# Patient Record
Sex: Female | Born: 2000 | Race: Black or African American | Hispanic: No | Marital: Single | Smoking: Never smoker
Health system: Southern US, Community
[De-identification: ages and names within clinical notes are randomized; demographics above are authoritative.]

## PROBLEM LIST (undated history)

## (undated) HISTORY — PX: ABDOMINAL SURGERY: SHX537

## (undated) HISTORY — PX: ADENOIDECTOMY: SUR15

## (undated) HISTORY — PX: WISDOM TOOTH EXTRACTION: SHX21

## (undated) HISTORY — PX: TONSILLECTOMY: SUR1361

---
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
## 2022-05-25 IMAGING — US US OB < 14 WEEKS - US OB TV
1 series · 14 of 28 positions shown · non-contrast
Comparison: Prior sonogram dated January 05, 2021

CLINICAL DATA: Vaginal bleeding., LMP 04/15/2021. Miscarriage in
late February 2021.

EXAM:
OBSTETRIC <14 WK US AND TRANSVAGINAL OB US
TECHNIQUE: Both transabdominal and transvaginal ultrasound examinations were
performed for complete evaluation of the gestation as well as the
maternal uterus, adnexal regions, and pelvic cul-de-sac.
Transvaginal technique was performed to assess early pregnancy.

[Series 1: us ob comp less 14 wks · 55 acquisitions, 14 frames shown]
[im 3/55]
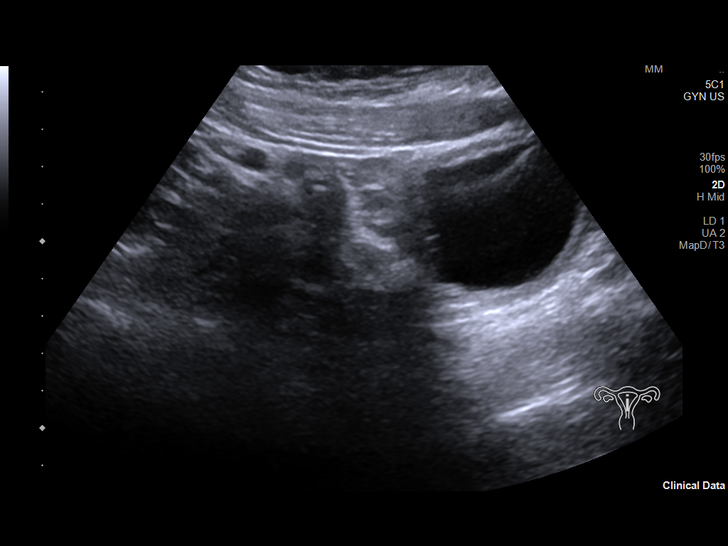
[im 7/55]
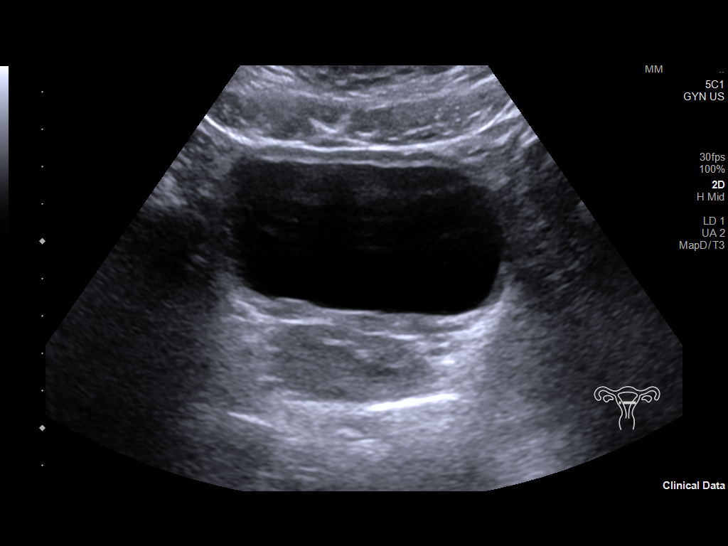
[im 11/55]
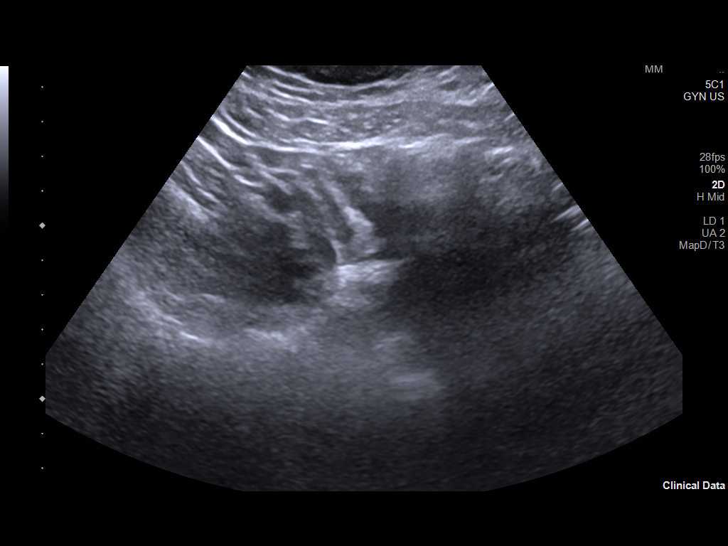
[im 15/55]
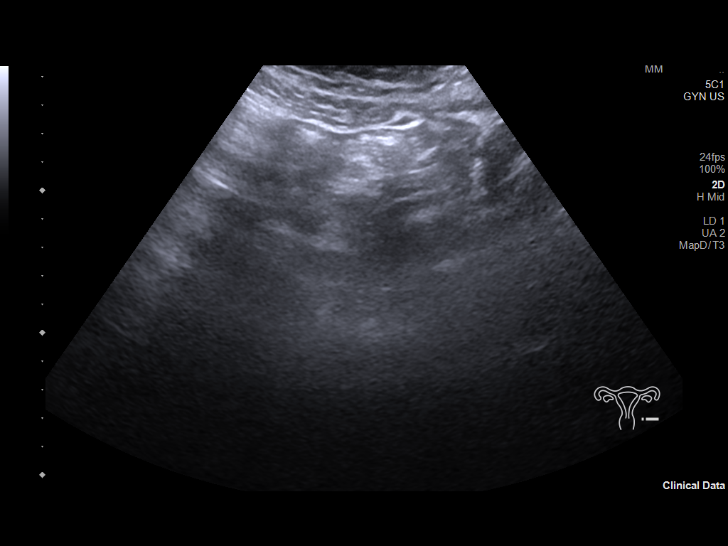
[im 19/55]
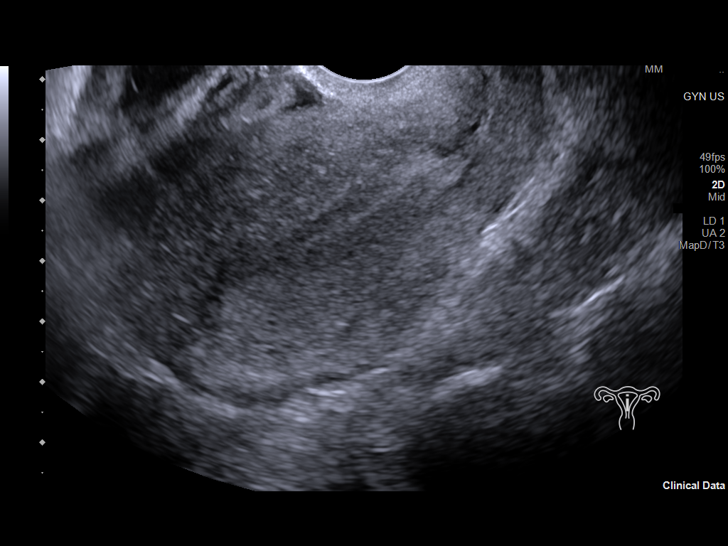
[im 23/55]
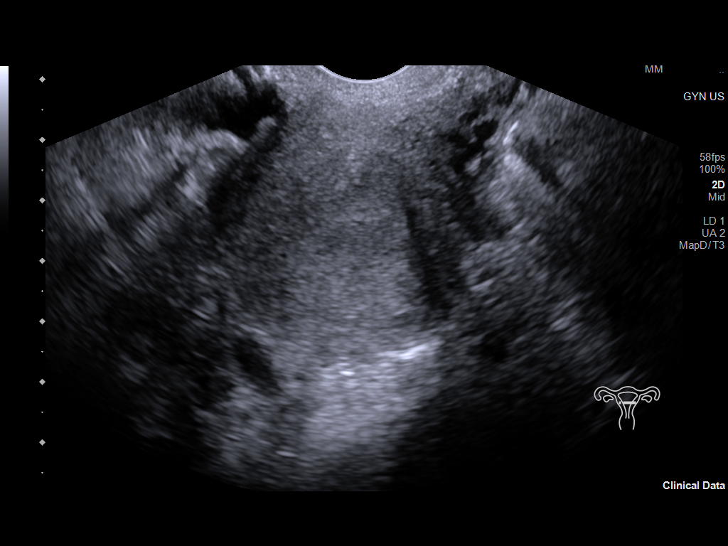
[im 27/55]
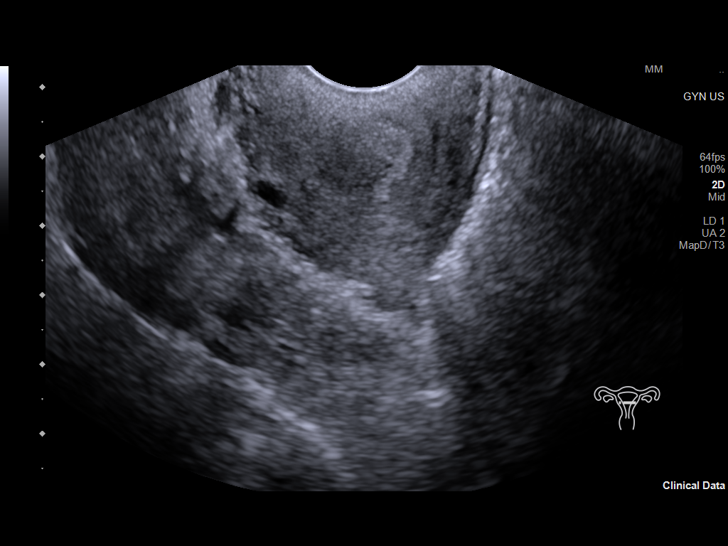
[im 31/55]
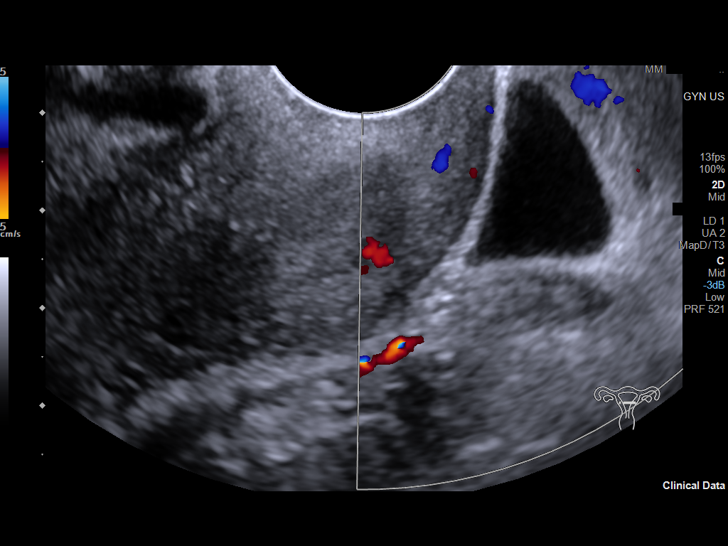
[im 35/55]
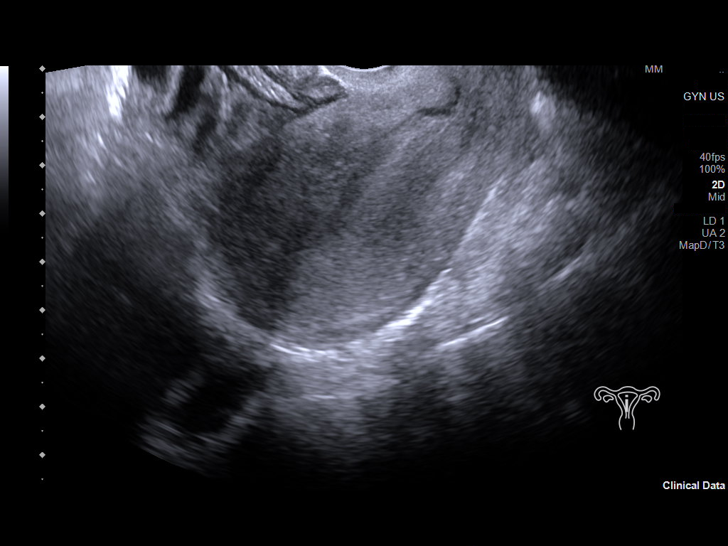
[im 39/55]
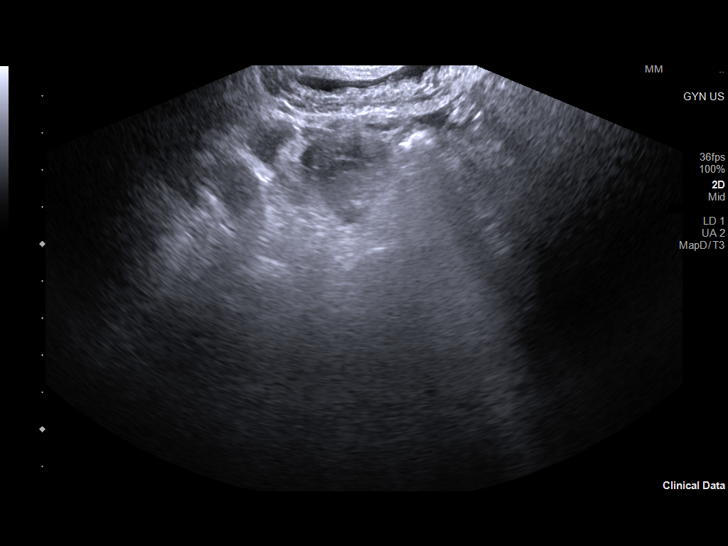
[im 43/55]
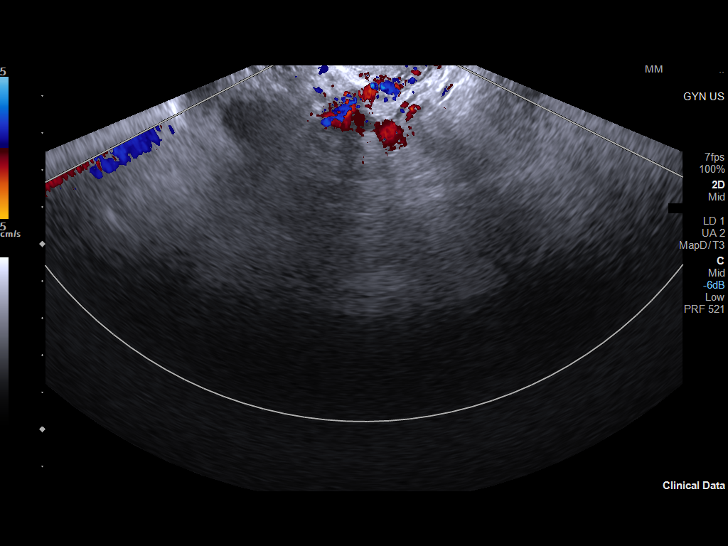
[im 47/55]
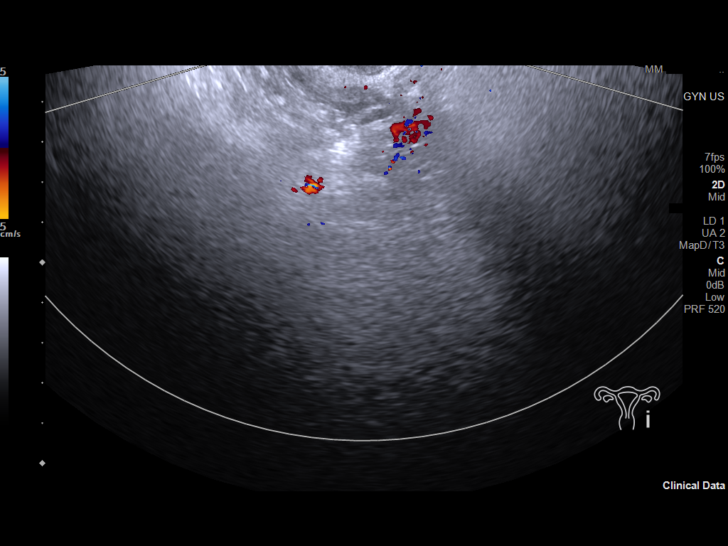
[im 51/55]
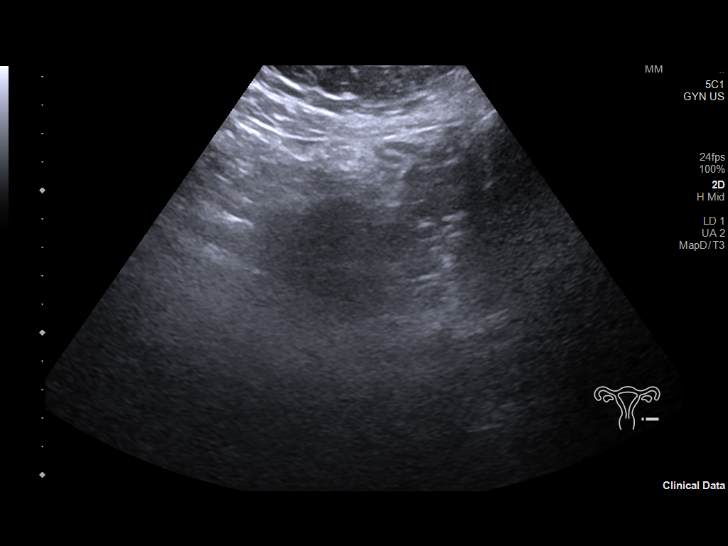
[im 55/55]
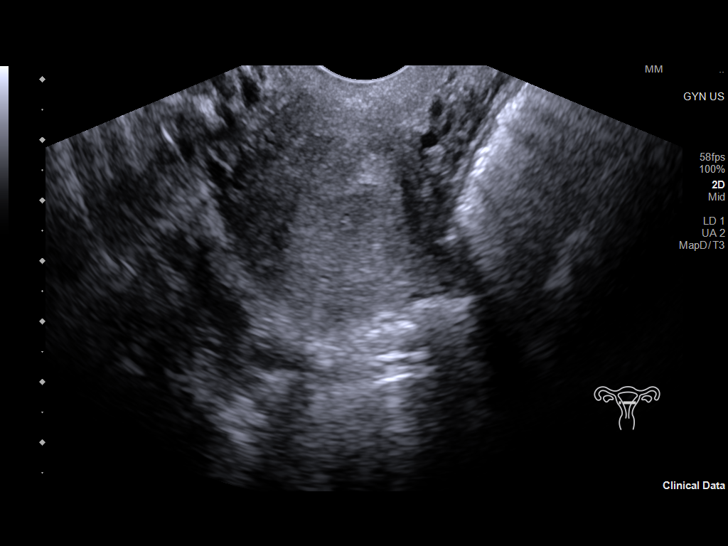

[14 of 28 positions shown; findings below may reference images not displayed]

FINDINGS: Nonvisualization of the gestational sac in the endometrial canal.
The endometrium is heterogeneous measuring up to 9 mm.

Maternal uterus/adnexae: The uterus measures 8.1 x 4.5 x 4.4 cm. The
endometrium measures approximately 9 mm in thickness. Bilateral
ovaries were not visualized.
IMPRESSION: Nonvisualization of the gestational sac. Findings concerning for
pregnancy of unknown location, which may represent early normal or
abnormal gestation or ectopic gestation. Correlate with serial beta
HCG and sonogram as clinically warranted.

## 2023-03-19 ENCOUNTER — Encounter (HOSPITAL_COMMUNITY): Payer: Self-pay | Admitting: Obstetrics and Gynecology

## 2023-03-19 ENCOUNTER — Inpatient Hospital Stay (HOSPITAL_COMMUNITY)
Admission: AD | Admit: 2023-03-19 | Discharge: 2023-03-19 | Disposition: A | Discharge status: Home / Self Care | Payer: Medicaid Other | Attending: Obstetrics and Gynecology | Admitting: Obstetrics and Gynecology

## 2023-03-19 ENCOUNTER — Inpatient Hospital Stay (HOSPITAL_COMMUNITY): Payer: Medicaid Other

## 2023-03-19 DIAGNOSIS — R109 Unspecified abdominal pain: Secondary | ICD-10-CM | POA: Diagnosis present

## 2023-03-19 DIAGNOSIS — O3680X0 Pregnancy with inconclusive fetal viability, not applicable or unspecified: Secondary | ICD-10-CM | POA: Diagnosis not present

## 2023-03-19 DIAGNOSIS — Z3A01 Less than 8 weeks gestation of pregnancy: Secondary | ICD-10-CM | POA: Insufficient documentation

## 2023-03-19 LAB — URINALYSIS, ROUTINE W REFLEX MICROSCOPIC
Bilirubin Urine: NEGATIVE
Glucose, UA: NEGATIVE mg/dL
Hgb urine dipstick: NEGATIVE
Ketones, ur: NEGATIVE mg/dL
Leukocytes,Ua: NEGATIVE
Nitrite: NEGATIVE
Protein, ur: NEGATIVE mg/dL
Specific Gravity, Urine: 1.025 (ref 1.005–1.030)
pH: 6 (ref 5.0–8.0)

## 2023-03-19 LAB — HCG, QUANTITATIVE, PREGNANCY: hCG, Beta Chain, Quant, S: 167 m[IU]/mL — ABNORMAL HIGH (ref <5)

## 2023-03-19 LAB — POCT PREGNANCY, URINE: Preg Test, Ur: POSITIVE — AB

## 2023-03-19 MED ORDER — VITAMIN B-6 50 MG PO TABS: 50.0000 mg | ORAL_TABLET | Freq: Every day | ORAL | Status: AC

## 2023-03-19 MED ORDER — ONDANSETRON 8 MG PO TBDP
8.0000 mg | ORAL_TABLET | Freq: Three times a day (TID) | ORAL | 0 refills | Status: AC | PRN
Start: 2023-03-19 — End: 2023-04-03

## 2023-03-19 NOTE — MAU Note (Signed)
.  Stefanie Diaz is a 22 y.o. at [redacted]w[redacted]d here in MAU reporting: for the last few weeks she has been having some lower abdominal cramping. States she had a +HPT x2 days ago. Now she has been having some nausea and vomiting as well. Has thrown up x3 in the last 24 hrs. Denies VB or abnormal discharge. Is very concerned because she has had a miscarriages in the past.   Pain score: 0 Vitals:   03/19/23 1042  BP: 135/85  Pulse: 92  Resp: 14  Temp: 98.4 F (36.9 C)  SpO2: 100%      Lab orders placed from triage:  UA

## 2023-03-19 NOTE — Discharge Instructions (Signed)
You have a pregnancy of unknown location.  Follow up is very important until it has been located, as it could be in your falopian tube (ectopic) which if ruptures can be life threatening.

## 2023-03-19 NOTE — MAU Provider Note (Signed)
Chief Complaint: Abdominal Pain and Nausea  SUBJECTIVE HPI: Stefanie Diaz is a 22 y.o. G4P0030 at [redacted]w[redacted]d by LMP who presents to maternity admissions reporting  last few weeks she has been having some lower abdominal cramping. States she had a +HPT x2 days ago.   Now she has been having some nausea and vomiting as well. Has thrown up x3 in the last 24 hrs. Denies VB or abnormal discharge. Is very concerned because she has had a miscarriages in the past.  She denies vaginal bleeding, vaginal itching/burning, urinary symptoms, h/a, dizziness, n/v, or fever/chills.     HPI  History reviewed. No pertinent past medical history. Past Surgical History:  Procedure Laterality Date   ABDOMINAL SURGERY     ADENOIDECTOMY     TONSILLECTOMY     WISDOM TOOTH EXTRACTION Bilateral    Social History   Socioeconomic History   Marital status: Single    Spouse name: Not on file   Number of children: Not on file   Years of education: Not on file   Highest education level: Not on file  Occupational History   Not on file  Tobacco Use   Smoking status: Never   Smokeless tobacco: Never  Vaping Use   Vaping status: Former   Substances: Nicotine, THC  Substance and Sexual Activity   Alcohol use: No   Drug use: Not Currently    Types: Marijuana    Comment: last used a week ago and stopped after finding out she was pregnant   Sexual activity: Not on file  Other Topics Concern   Not on file  Social History Narrative   Beatryce is a 12th grade student.   She attends [employer].   She lives with both parents.   She has three siblings.   Social Determinants of Health   Financial Resource Strain: Not on file  Food Insecurity: Not on file  Transportation Needs: Not on file  Physical Activity: Not on file  Stress: Not on file  Social Connections: Not on file  Intimate Partner Violence: Not At Risk (10/30/2022)   Received from [facility]   Humiliation, Afraid, Rape, and Kick questionnaire     Fear of Current or Ex-Partner: No    Emotionally Abused: No    Physically Abused: No    Sexually Abused: No   No current facility-administered medications on file prior to encounter.   Current Outpatient Medications on File Prior to Encounter  Medication Sig Dispense Refill   acetaminophen (TYLENOL) 500 MG tablet Take 500 mg by mouth every 6 (six) hours as needed for mild pain (cramps).     multivitamin (VIT W/EXTRA C) CHEW chewable tablet Chew 2 tablets by mouth daily.     ondansetron (ZOFRAN-ODT) 8 MG disintegrating tablet Take 1 tablet (8 mg total) by mouth every 8 (eight) hours as needed for nausea or vomiting. 20 tablet 0   [DISCONTINUED] cetirizine (ZYRTEC) 1 MG/ML syrup Take 10 mLs (10 mg total) by mouth daily. (Patient not taking: Reported on 06/24/2020) 120 mL 12   Allergies  Allergen Reactions   Other Itching    Oxyclean, certain bath soaps   Penicillins Rash    I have reviewed patient's Past Medical Hx, Surgical Hx, Family Hx, Social Hx, medications and allergies.   ROS:  Review of Systems Review of Systems  Other systems negative   Physical Exam  Physical Exam Patient Vitals for the past 24 hrs:  BP Temp Temp src Pulse Resp SpO2 Weight  03/19/23 1049 -- -- -- -- -- -- 106.1 kg  03/19/23 1042 135/85 98.4 F (36.9 C) Oral 92 14 100 % --   Constitutional: Well-developed, well-nourished female in no acute distress.  Cardiovascular: normal rate Respiratory: normal effort GI: Abd soft, non-tender, no rigidity, no rebound. Pos BS x 4 MS: Extremities nontender, no edema, normal ROM Neurologic: Alert and oriented x 4.  GU: Neg CVAT.   LAB RESULTS Results for orders placed or performed during the hospital encounter of 03/19/23 (from the past 24 hour(s))  Pregnancy, urine POC     Status: Abnormal   Collection Time: 03/19/23 10:27 AM  Result Value Ref Range   Preg Test, Ur POSITIVE (A) NEGATIVE  Urinalysis, Routine w reflex microscopic -Urine, Clean Catch      Status: Abnormal   Collection Time: 03/19/23 10:27 AM  Result Value Ref Range   Color, Urine YELLOW YELLOW   APPearance CLOUDY (A) CLEAR   Specific Gravity, Urine 1.025 1.005 - 1.030   pH 6.0 5.0 - 8.0   Glucose, UA NEGATIVE NEGATIVE mg/dL   Hgb urine dipstick NEGATIVE NEGATIVE   Bilirubin Urine NEGATIVE NEGATIVE   Ketones, ur NEGATIVE NEGATIVE mg/dL   Protein, ur NEGATIVE NEGATIVE mg/dL   Nitrite NEGATIVE NEGATIVE   Leukocytes,Ua NEGATIVE NEGATIVE       IMAGING US OB LESS THAN 14 WEEKS WITH OB TRANSVAGINAL  Result Date: 03/19/2023 CLINICAL DATA:  Cramping for a few weeks. Positive urine pregnancy test EXAM: OBSTETRIC <14 WK [place] AND TRANSVAGINAL OB US TECHNIQUE: Both transabdominal and transvaginal ultrasound examinations were performed for complete evaluation of the gestation as well as the maternal uterus, adnexal regions, and pelvic cul-de-sac. Transvaginal technique was performed to assess early pregnancy. COMPARISON:  None Available. FINDINGS: Uterus: Transabdominal images are limited due to contracted urinary bladder and overlapping bowel gas and soft tissue. Poor sonographic window. Preserved myometrium. Uterus was not measured by the sonographer. Endometrium: Endometrial stripe estimated at 12 mm. No intrauterine pregnancy. Ovaries: Left ovary has small follicles and measures 2.9 x 1.8 x 2.3 cm. Right ovary appears similar measuring 1.3 by 1.9 x 1.0 cm. Other: Small amount of free fluid in the dependent pelvis. IMPRESSION: No intrauterine pregnancy identified. Trace free fluid in the pelvis. Limited transabdominal images. Overall ectopic is not excluded as there is a positive pregnancy test and no IUP. Recommend close follow-up with serial beta HCG and ultrasound. Electronically Signed   By: Karen Kays M.D.   On: 03/19/2023 12:06    MAU Management/MDM: I have reviewed the triage vital signs and the nursing notes.   Pertinent labs & imaging results that were available during my care  of the patient were reviewed by me and considered in my medical decision making (see chart for details). I have reviewed her medical records including past results, notes and treatments. Medical, Surgical, and family history were reviewed.  Medications and recent lab tests were reviewed  Ordered usual first trimester r/o ectopic labs.   Pelvic exam and cultures done Will check baseline Ultrasound to rule out ectopic.  Treatments in MAU included UPT, serum HCG, TVUS.   This bleeding/pain can represent a normal pregnancy with bleeding, spontaneous abortion or even an ectopic which can be life-threatening.  The process as listed above helps to determine which of these is present.   ASSESSMENT 1. Pregnancy of unknown anatomic location   2. [redacted] weeks gestation of pregnancy   Positive UPT in MAU and at home  LMP 02/15/23 with EGA  [redacted]w[redacted]d HCG  [place] without IUP or adnexal mass  PLAN Discharge home Plan to repeat HCG level in 48 hours in MAU per 11:00 am schedule Will repeat  Ultrasound in about 7-10 days if HCG levels double appropriately  Ectopic precautions  Pt stable at time of discharge. Encouraged to return here if she develops worsening of symptoms, increase in pain, fever, or other concerning symptoms.   Wyn Forster, MD FMOB Fellow, Faculty practice [facility], Center for [facility]  03/19/2023  1:03 PM

## 2023-03-21 ENCOUNTER — Ambulatory Visit (INDEPENDENT_AMBULATORY_CARE_PROVIDER_SITE_OTHER): Payer: Medicaid Other | Admitting: General Practice

## 2023-03-21 ENCOUNTER — Ambulatory Visit: Payer: Medicaid Other

## 2023-03-21 VITALS — BP 129/67 | HR 69 | Ht 60.0 in | Wt 233.0 lb

## 2023-03-21 DIAGNOSIS — Z3A01 Less than 8 weeks gestation of pregnancy: Secondary | ICD-10-CM

## 2023-03-21 DIAGNOSIS — O3680X0 Pregnancy with inconclusive fetal viability, not applicable or unspecified: Secondary | ICD-10-CM

## 2023-03-21 LAB — BETA HCG QUANT (REF LAB): hCG Quant: 264 m[IU]/mL

## 2023-03-21 NOTE — Progress Notes (Signed)
Beta HCG Follow-up Visit  Stefanie Diaz presents to [facility] for follow-up beta HCG lab. She was seen in MAU for abdominal pain on 9/9. Patient reports  continued intermittent cramping  today. Discussed with patient that we are following beta HCG levels today. Results will be back in approximately 2 hours. Valid contact number for patient confirmed. I will call the patient with results.   Beta HCG results:          9/9         167           9/11         264      Results and patient history reviewed with Edd Arbour, who states bhcg levels have risen appropriately, patient should have follow up ultrasound in 2 weeks. Patient called and informed of plan for follow-up. Scheduled ultrasound for 9/25. Patient will return to MAU if needed for bleeding or severe pain.  [name] 03/21/2023 1:09 PM

## 2023-04-03 ENCOUNTER — Other Ambulatory Visit: Payer: Self-pay

## 2023-04-03 DIAGNOSIS — O3680X0 Pregnancy with inconclusive fetal viability, not applicable or unspecified: Secondary | ICD-10-CM

## 2023-04-04 ENCOUNTER — Ambulatory Visit (INDEPENDENT_AMBULATORY_CARE_PROVIDER_SITE_OTHER): Payer: Medicaid Other

## 2023-04-04 ENCOUNTER — Other Ambulatory Visit: Payer: Self-pay

## 2023-04-04 DIAGNOSIS — Z3481 Encounter for supervision of other normal pregnancy, first trimester: Secondary | ICD-10-CM | POA: Diagnosis not present

## 2023-04-04 DIAGNOSIS — Z3A01 Less than 8 weeks gestation of pregnancy: Secondary | ICD-10-CM | POA: Diagnosis not present

## 2023-04-04 DIAGNOSIS — O3680X0 Pregnancy with inconclusive fetal viability, not applicable or unspecified: Secondary | ICD-10-CM

## 2023-04-19 ENCOUNTER — Inpatient Hospital Stay (HOSPITAL_COMMUNITY): Payer: Medicaid Other

## 2023-04-19 ENCOUNTER — Encounter (HOSPITAL_COMMUNITY): Payer: Self-pay | Admitting: Obstetrics & Gynecology

## 2023-04-19 ENCOUNTER — Inpatient Hospital Stay (HOSPITAL_COMMUNITY)
Admission: AD | Admit: 2023-04-19 | Discharge: 2023-04-19 | Disposition: A | Discharge status: Home / Self Care | Payer: Medicaid Other | Attending: Obstetrics & Gynecology | Admitting: Obstetrics & Gynecology

## 2023-04-19 DIAGNOSIS — O209 Hemorrhage in early pregnancy, unspecified: Secondary | ICD-10-CM

## 2023-04-19 DIAGNOSIS — O021 Missed abortion: Secondary | ICD-10-CM

## 2023-04-19 DIAGNOSIS — Z3A09 9 weeks gestation of pregnancy: Secondary | ICD-10-CM

## 2023-04-19 LAB — CBC
HCT: 37.4 % (ref 36.0–46.0)
Hemoglobin: 12.6 g/dL (ref 12.0–15.0)
MCH: 27.3 pg (ref 26.0–34.0)
MCHC: 33.7 g/dL (ref 30.0–36.0)
MCV: 81.1 fL (ref 80.0–100.0)
Platelets: 430 10*3/uL — ABNORMAL HIGH (ref 150–400)
RBC: 4.61 MIL/uL (ref 3.87–5.11)
RDW: 13.9 % (ref 11.5–15.5)
WBC: 8.4 10*3/uL (ref 4.0–10.5)
nRBC: 0 % (ref 0.0–0.2)

## 2023-04-19 LAB — WET PREP, GENITAL
Sperm: NONE SEEN
Trich, Wet Prep: NONE SEEN
WBC, Wet Prep HPF POC: <10 (ref <10)
Yeast Wet Prep HPF POC: NONE SEEN

## 2023-04-19 LAB — HIV ANTIBODY (ROUTINE TESTING W REFLEX): HIV Screen 4th Generation wRfx: NONREACTIVE

## 2023-04-19 MED ORDER — MISOPROSTOL 200 MCG PO TABS
ORAL_TABLET | ORAL | 1 refills | Status: AC
Start: 2023-04-19 — End: ?

## 2023-04-19 MED ORDER — ONDANSETRON 4 MG PO TBDP
4.0000 mg | ORAL_TABLET | Freq: Four times a day (QID) | ORAL | 0 refills | Status: AC | PRN
Start: 2023-04-19 — End: ?

## 2023-04-19 MED ORDER — OXYCODONE-ACETAMINOPHEN 5-325 MG PO TABS
1.0000 | ORAL_TABLET | Freq: Four times a day (QID) | ORAL | 0 refills | Status: AC | PRN
Start: 2023-04-19 — End: ?

## 2023-04-19 NOTE — MAU Note (Signed)
.  Stefanie Diaz is a 22 y.o. at [redacted]w[redacted]d here in MAU reporting having a lot of VB at 1500. Alos having abdominal cramping. Pt went to ED in [place] and was told they could not really help her and was told to come here. Has used 4 pads since bleeding started. Saw one small clot earlier. Hx 3 SABs  Onset of complaint: 1500 Pain score: 8 Vitals:   04/19/23 2018 04/19/23 2020  BP:  125/79  Pulse: 69   Resp: 16   Temp: (!) 97.3 F (36.3 C)   SpO2: 100%      FHT:n/a Lab orders placed from triage:  none

## 2023-04-19 NOTE — MAU Provider Note (Signed)
Chief Complaint: Vaginal Bleeding  Seen by providerat 2120hrs      SUBJECTIVE HPI: Stefanie Diaz is a 22 y.o. G4P0030 at [redacted]w[redacted]d by LMP who presents to maternity admissions reporting heavy vaginal bleeding and pelvic cramping.  Was seen last on .04/04/23 and US showed [redacted]w[redacted]d live single fetus.  Has a history of 3 prior miscarriages.  One was at 14 weeks. Had a workup in [place] and they told her everything was normal  Would like another workup done She denies urinary symptoms, h/a, dizziness, n/v, or fever/chills.     Vaginal Bleeding The patient's primary symptoms include pelvic pain and vaginal bleeding. The patient's pertinent negatives include no genital odor. The current episode started today. Associated symptoms include abdominal pain. Pertinent negatives include no chills, dysuria or fever. The vaginal discharge was bloody. The vaginal bleeding is typical of menses. She has been passing clots. She has not been passing tissue. Nothing aggravates the symptoms. She has tried nothing for the symptoms.   RN Note: .Stefanie Diaz is a 22 y.o. at [redacted]w[redacted]d here in MAU reporting having a lot of VB at 1500. Alos having abdominal cramping. Pt went to ED in [place] and was told they could not really help her and was told to come here. Has used 4 pads since bleeding started. Saw one small clot earlier. Hx 3 SABs   No past medical history on file. Past Surgical History:  Procedure Laterality Date   ABDOMINAL SURGERY     ADENOIDECTOMY     TONSILLECTOMY     WISDOM TOOTH EXTRACTION Bilateral    Social History   Socioeconomic History   Marital status: Single    Spouse name: Not on file   Number of children: Not on file   Years of education: Not on file   Highest education level: Not on file  Occupational History   Not on file  Tobacco Use   Smoking status: Never   Smokeless tobacco: Never  Vaping Use   Vaping status: Former   Substances: Nicotine, THC  Substance and Sexual Activity   Alcohol use: No    Drug use: Not Currently    Types: Marijuana    Comment: last used a week ago and stopped after finding out she was pregnant   Sexual activity: Not on file  Other Topics Concern   Not on file  Social History Narrative   Stefanie Diaz is a 12th grade student.   She attends [employer].   She lives with both parents.   She has three siblings.   Social Determinants of Health   Financial Resource Strain: Not on file  Food Insecurity: Not on file  Transportation Needs: Not on file  Physical Activity: Not on file  Stress: Not on file  Social Connections: Not on file  Intimate Partner Violence: Not At Risk (10/30/2022)   [facility]   Humiliation, Afraid, Rape, and Kick questionnaire    Fear of Current or Ex-Partner: No    Emotionally Abused: No    Physically Abused: No    Sexually Abused: No   No current facility-administered medications on file prior to encounter.   Current Outpatient Medications on File Prior to Encounter  Medication Sig Dispense Refill   acetaminophen (TYLENOL) 500 MG tablet Take 500 mg by mouth every 6 (six) hours as needed for mild pain (cramps). (Patient not taking: Reported on 03/21/2023)     Prenatal Vit-Fe Fumarate-FA (MULTIVITAMIN-PRENATAL) 27-0.8 MG TABS tablet Take 1 tablet by mouth daily  at 12 noon.     pyridOXINE (VITAMIN B6) 50 MG tablet Take 1 tablet (50 mg total) by mouth daily. (Patient not taking: Reported on 03/21/2023)     [DISCONTINUED] cetirizine (ZYRTEC) 1 MG/ML syrup Take 10 mLs (10 mg total) by mouth daily. (Patient not taking: Reported on 06/24/2020) 120 mL 12   Allergies  Allergen Reactions   Other Itching    Oxyclean, certain bath soaps   Penicillins Rash    I have reviewed patient's Past Medical Hx, Surgical Hx, Family Hx, Social Hx, medications and allergies.   ROS:  Review of Systems  Constitutional:  Negative for chills and fever.  Gastrointestinal:  Positive for abdominal pain.  Genitourinary:  Positive for  pelvic pain and vaginal bleeding. Negative for dysuria.   Review of Systems  Other systems negative   Physical Exam  Physical Exam Patient Vitals for the past 24 hrs:  BP Temp Pulse Resp SpO2 Height Weight  04/19/23 2020 125/79 -- -- -- -- -- --  04/19/23 2018 -- (!) 97.3 F (36.3 C) 69 16 100 % 5' (1.524 m) 102.5 kg   Constitutional: Well-developed, well-nourished female in no acute distress.  Cardiovascular: normal rate Respiratory: normal effort GI: Abd soft, non-tender.  MS: Extremities nontender, no edema, normal ROM Neurologic: Alert and oriented x 4.  GU: Neg CVAT.  PELVIC EXAM: deferred    LAB RESULTS Results for orders placed or performed during the hospital encounter of 04/19/23 (from the past 24 hour(s))  Wet prep, genital     Status: Abnormal   Collection Time: 04/19/23  8:35 PM   Specimen: Vaginal  Result Value Ref Range   Yeast Wet Prep HPF POC NONE SEEN NONE SEEN   Trich, Wet Prep NONE SEEN NONE SEEN   Clue Cells Wet Prep HPF POC PRESENT (A) NONE SEEN   WBC, Wet Prep HPF POC <10 <10   Sperm NONE SEEN   CBC     Status: Abnormal   Collection Time: 04/19/23  8:46 PM  Result Value Ref Range   WBC 8.4 4.0 - 10.5 K/uL   RBC 4.61 3.87 - 5.11 MIL/uL   Hemoglobin 12.6 12.0 - 15.0 g/dL   HCT 81.1 91.4 - 78.2 %   MCV 81.1 80.0 - 100.0 fL   MCH 27.3 26.0 - 34.0 pg   MCHC 33.7 30.0 - 36.0 g/dL   RDW 95.6 21.3 - 08.6 %   Platelets 430 (H) 150 - 400 K/uL   nRBC 0.0 0.0 - 0.2 %  HIV Antibody (routine testing w rflx)     Status: None   Collection Time: 04/19/23  8:46 PM  Result Value Ref Range   HIV Screen 4th Generation wRfx Non Reactive Non Reactive       IMAGING US OB Transvaginal  Result Date: 04/19/2023 CLINICAL DATA:  Pregnant patient in first-trimester pregnancy with vaginal bleeding tonight. EXAM: TRANSVAGINAL OB ULTRASOUND TECHNIQUE: Transvaginal ultrasound was performed for complete evaluation of the gestation as well as the maternal uterus,  adnexal regions, and pelvic cul-de-sac. COMPARISON:  Obstetric ultrasound 04/04/2023 FINDINGS: Intrauterine gestational sac: Single Yolk sac:  Faintly visualized. Embryo:  Visualized. Cardiac Activity: No longer visualized. Heart Rate: 0 bpm CRL:   9.7 mm   7 w 0 d                  [place] EDC: 12/06/2023 Subchorionic hemorrhage:  None visualized. Maternal uterus/adnexae: Both ovaries are visualized and are normal. Small volume of simple free fluid  in the pelvis. IMPRESSION: Single intrauterine pregnancy with absent fetal heart tones. Findings meet definitive criteria for failed pregnancy. This follows SRU consensus guidelines: Diagnostic Criteria for Nonviable Pregnancy Early in the First Trimester. [facility] Med [phone]. Electronically Signed   By: Narda Rutherford M.D.   On: 04/19/2023 21:49     MAU Management/MDM: I have reviewed the triage vital signs and the nursing notes.   Pertinent labs & imaging results that were available during my care of the patient were reviewed by me and considered in my medical decision making (see chart for details).      I have reviewed her medical records including past results, notes and treatments. Medical, Surgical, and family history were reviewed.  Medications and recent lab tests were reviewed  Ordered followup Ultrasound to rule out SAB which was what we found  Embryo has no cardiac activity tonight  Reviewed findings with patient who was grieving Reviewed options, wants expectant management but wants Rx for meds incase she changes her mind 4 stuffed hearts given to patient to commemorate her 4 losses.   ASSESSMENT 1. Missed abortion   2. [redacted] weeks gestation of pregnancy     PLAN Discharge home Expectant management Rx Cytotec for possible medical management Rx Percocet for pain and Zofran for nausea SAB precautions Followup in office   Follow-up Information     [facility] Follow up.    Specialty: Obstetrics and Gynecology Why: Someone from our office will call you Contact information: [address] [place] [address] [phone]               Pt stable at time of discharge. Encouraged to return here if she develops worsening of symptoms, increase in pain, fever, or other concerning symptoms.    Wynelle Bourgeois CNM, MSN Certified Nurse-Midwife 04/19/2023  10:49 PM

## 2023-04-19 NOTE — Discharge Instructions (Signed)

## 2023-04-20 ENCOUNTER — Telehealth (HOSPITAL_COMMUNITY): Payer: Self-pay

## 2023-04-20 LAB — GC/CHLAMYDIA PROBE AMP (~~LOC~~) NOT AT ARMC
Chlamydia: POSITIVE — AB
Comment: NEGATIVE
Comment: NORMAL
Neisseria Gonorrhea: NEGATIVE

## 2023-04-20 MED ORDER — DOXYCYCLINE HYCLATE 100 MG PO CAPS: 100.0000 mg | ORAL_CAPSULE | Freq: Two times a day (BID) | ORAL | 0 refills | Status: AC

## 2023-04-25 ENCOUNTER — Other Ambulatory Visit: Payer: Self-pay | Admitting: Advanced Practice Midwife

## 2023-04-25 MED ORDER — AZITHROMYCIN 500 MG PO TABS: 1000.0000 mg | ORAL_TABLET | Freq: Once | ORAL | 0 refills | Status: AC

## 2023-04-25 NOTE — Progress Notes (Signed)
+   chlamydia on 10/10 WIll have RN call to notify patient   Aviva Signs, CNM

## 2023-05-01 ENCOUNTER — Ambulatory Visit: Payer: Medicaid Other | Admitting: Family Medicine

## 2023-05-02 ENCOUNTER — Telehealth: Payer: Medicaid Other

## 2023-05-11 ENCOUNTER — Encounter: Payer: Medicaid Other | Admitting: Obstetrics and Gynecology
